# Patient Record
Sex: Male | Born: 1995 | Race: White | Hispanic: No | Marital: Single | Smoking: Never smoker
Health system: Southern US, Community
[De-identification: ages and names within clinical notes are randomized; demographics above are authoritative.]

---
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
## 2019-04-15 ENCOUNTER — Other Ambulatory Visit: Payer: Self-pay

## 2019-04-15 DIAGNOSIS — Z20822 Contact with and (suspected) exposure to covid-19: Secondary | ICD-10-CM

## 2019-04-18 LAB — NOVEL CORONAVIRUS, NAA: SARS-CoV-2, NAA: NOT DETECTED

## 2021-07-18 ENCOUNTER — Other Ambulatory Visit: Payer: Self-pay

## 2021-07-18 ENCOUNTER — Ambulatory Visit
Admission: EM | Admit: 2021-07-18 | Discharge: 2021-07-18 | Disposition: A | Discharge status: Home / Self Care | Payer: Self-pay | Attending: Urgent Care | Admitting: Urgent Care

## 2021-07-18 ENCOUNTER — Encounter: Payer: Self-pay | Admitting: Emergency Medicine

## 2021-07-18 DIAGNOSIS — J020 Streptococcal pharyngitis: Secondary | ICD-10-CM

## 2021-07-18 DIAGNOSIS — R0982 Postnasal drip: Secondary | ICD-10-CM

## 2021-07-18 DIAGNOSIS — R070 Pain in throat: Secondary | ICD-10-CM

## 2021-07-18 DIAGNOSIS — Z9089 Acquired absence of other organs: Secondary | ICD-10-CM

## 2021-07-18 LAB — POCT RAPID STREP A (OFFICE): Rapid Strep A Screen: POSITIVE — AB

## 2021-07-18 MED ORDER — AMOXICILLIN 500 MG PO CAPS: 500.0000 mg | ORAL_CAPSULE | Freq: Two times a day (BID) | ORAL | 0 refills | Status: AC

## 2021-07-18 MED ORDER — LEVOCETIRIZINE DIHYDROCHLORIDE 5 MG PO TABS: 5.0000 mg | ORAL_TABLET | Freq: Every evening | ORAL | 0 refills | Status: AC

## 2021-07-18 NOTE — ED Triage Notes (Addendum)
Pt reports sore throat x 8-9 days. Pt reports left sided throat pain, lymph node swelling, and pain with swallowing. ?

## 2021-07-18 NOTE — ED Provider Notes (Signed)
?[place] ? ? ?MRN: [id number] DOB: 1996-03-24 ? ?Subjective:  ? ?Timothy Orr is a 26 y.o. male presenting for 9-day history of persistent throat pain, worse on the left side with lymph node swelling and painful swallowing.  Patient has a remote history of tonsillectomy as a child.  Denies cough, chest pain, shortness of breath or wheezing.  Patient smokes marijuana and vapes daily. ? ?No current facility-administered medications for this encounter. ?No current outpatient medications on file.  ? ?No Known Allergies ? ?History reviewed. No pertinent past medical history.  ? ?Past Surgical History:  ?Procedure Laterality Date  ? TONSILLECTOMY    ? ? ?History reviewed. No pertinent family history. ? ?Social History  ? ?Tobacco Use  ? Smoking status: Never  ? Smokeless tobacco: Current  ?Vaping Use  ? Vaping Use: Every day  ?Substance Use Topics  ? Alcohol use: Yes  ?  Comment: daily  ? Drug use: Yes  ?  Types: Marijuana  ?  Comment: daily  ? ? ?ROS ? ? ?Objective:  ? ?Vitals: ?BP (!) 165/80 (BP Location: Right Arm)   Pulse 88   Temp (!) 97.5 ?F (36.4 ?C) (Oral)   Resp 18   Ht 6\' 2"  (1.88 m)   Wt 270 lb (122.5 kg)   SpO2 97%   BMI 34.67 kg/m?  ? ?Physical Exam ?Constitutional:   ?   General: He is not in acute distress. ?   Appearance: Normal appearance. He is well-developed and normal weight. He is not ill-appearing, toxic-appearing or diaphoretic.  ?HENT:  ?   Head: Normocephalic and atraumatic.  ?   Right Ear: External ear normal.  ?   Left Ear: External ear normal.  ?   Nose: Nose normal.  ?   Mouth/Throat:  ?   Pharynx: Oropharynx is clear. Posterior oropharyngeal erythema (with associated postnasal drainage overlying the pharynx) present. No pharyngeal swelling, oropharyngeal exudate or uvula swelling.  ?   Tonsils: No tonsillar exudate or tonsillar abscesses. 0 on the right. 0 on the left.  ?Eyes:  ?   General: No scleral icterus.    ?   Right eye: No discharge.     ?   Left  eye: No discharge.  ?   Extraocular Movements: Extraocular movements intact.  ?Cardiovascular:  ?   Rate and Rhythm: Normal rate.  ?Pulmonary:  ?   Effort: Pulmonary effort is normal.  ?Musculoskeletal:  ?   Cervical back: Normal range of motion.  ?Neurological:  ?   Mental Status: He is alert and oriented to person, place, and time.  ?Psychiatric:     ?   Mood and Affect: Mood normal.     ?   Behavior: Behavior normal.     ?   Thought Content: Thought content normal.     ?   Judgment: Judgment normal.  ? ? ?Results for orders placed or performed during the hospital encounter of 07/18/21 (from the past 24 hour(s))  ?POCT rapid strep A     Status: Abnormal  ? Collection Time: 07/18/21  2:08 PM  ?Result Value Ref Range  ? Rapid Strep A Screen Positive (A) Negative  ? ? ?Assessment and Plan :  ? ?PDMP not reviewed this encounter. ? ?1. Strep pharyngitis   ?2. Throat pain   ?3. Post-nasal drainage   ?4. History of tonsillectomy   ? ?Will treat for strep pharyngitis.  Patient is to start amoxicillin, use supportive care otherwise.  In the  context of his smoking marijuana and vaping daily recommended Xyzal long-term with pseudoephedrine as needed.  Encouraged patient to quit smoking and vaping.  Counseled patient on potential for adverse effects with medications prescribed/recommended today, ER and return-to-clinic precautions discussed, patient verbalized understanding. ? ?  ?Jaynee Eagles, PA-C ?07/18/21 1430 ? ?

## 2023-07-21 ENCOUNTER — Ambulatory Visit: Payer: Self-pay

## 2023-07-21 ENCOUNTER — Other Ambulatory Visit: Payer: Self-pay | Admitting: Family Medicine

## 2023-07-21 DIAGNOSIS — Z021 Encounter for pre-employment examination: Secondary | ICD-10-CM
